# Patient Record
Sex: Female | Born: 1935 | Race: White | Hispanic: No
Health system: Southern US, Community
[De-identification: ages and names within clinical notes are randomized; demographics above are authoritative.]

## PROBLEM LIST (undated history)

## (undated) HISTORY — PX: CHOLECYSTECTOMY: SHX55

## (undated) HISTORY — PX: MINOR CARPAL TUNNEL: SHX6472

---

## 2018-10-11 ENCOUNTER — Emergency Department (HOSPITAL_COMMUNITY): Payer: Medicare Other

## 2018-10-11 ENCOUNTER — Encounter (HOSPITAL_COMMUNITY): Payer: Self-pay | Admitting: Emergency Medicine

## 2018-10-11 ENCOUNTER — Emergency Department (HOSPITAL_COMMUNITY)
Admission: EM | Admit: 2018-10-11 | Discharge: 2018-10-12 | Disposition: A | Discharge status: Home / Self Care | Payer: Medicare Other | Attending: Emergency Medicine | Admitting: Emergency Medicine

## 2018-10-11 DIAGNOSIS — F919 Conduct disorder, unspecified: Secondary | ICD-10-CM | POA: Diagnosis not present

## 2018-10-11 DIAGNOSIS — R911 Solitary pulmonary nodule: Secondary | ICD-10-CM | POA: Insufficient documentation

## 2018-10-11 DIAGNOSIS — F10221 Alcohol dependence with intoxication delirium: Secondary | ICD-10-CM | POA: Diagnosis not present

## 2018-10-11 DIAGNOSIS — F10929 Alcohol use, unspecified with intoxication, unspecified: Secondary | ICD-10-CM | POA: Diagnosis not present

## 2018-10-11 DIAGNOSIS — R4182 Altered mental status, unspecified: Secondary | ICD-10-CM | POA: Diagnosis present

## 2018-10-11 DIAGNOSIS — Z79899 Other long term (current) drug therapy: Secondary | ICD-10-CM | POA: Diagnosis not present

## 2018-10-11 LAB — CBC WITH DIFFERENTIAL/PLATELET
Abs Immature Granulocytes: 0.02 10*3/uL (ref 0.00–0.07)
Basophils Absolute: 0.1 10*3/uL (ref 0.0–0.1)
Basophils Relative: 1 %
Eosinophils Absolute: 0.1 10*3/uL (ref 0.0–0.5)
Eosinophils Relative: 1 %
HCT: 41.5 % (ref 36.0–46.0)
Hemoglobin: 13.6 g/dL (ref 12.0–15.0)
Immature Granulocytes: 0 %
Lymphocytes Relative: 38 %
Lymphs Abs: 2.7 10*3/uL (ref 0.7–4.0)
MCH: 30.6 pg (ref 26.0–34.0)
MCHC: 32.8 g/dL (ref 30.0–36.0)
MCV: 93.5 fL (ref 80.0–100.0)
Monocytes Absolute: 0.5 10*3/uL (ref 0.1–1.0)
Monocytes Relative: 7 %
Neutro Abs: 3.7 10*3/uL (ref 1.7–7.7)
Neutrophils Relative %: 53 %
Platelets: 242 10*3/uL (ref 150–400)
RBC: 4.44 MIL/uL (ref 3.87–5.11)
RDW: 12.2 % (ref 11.5–15.5)
WBC: 7 10*3/uL (ref 4.0–10.5)
nRBC: 0 % (ref 0.0–0.2)

## 2018-10-11 LAB — COMPREHENSIVE METABOLIC PANEL
ALT: 18 U/L (ref 0–44)
AST: 30 U/L (ref 15–41)
Albumin: 4.1 g/dL (ref 3.5–5.0)
Alkaline Phosphatase: 57 U/L (ref 38–126)
Anion gap: 13 (ref 5–15)
BUN: 7 mg/dL — ABNORMAL LOW (ref 8–23)
CO2: 23 mmol/L (ref 22–32)
Calcium: 9 mg/dL (ref 8.9–10.3)
Chloride: 107 mmol/L (ref 98–111)
Creatinine, Ser: 0.72 mg/dL (ref 0.44–1.00)
GFR calc Af Amer: >60 mL/min (ref >60)
GFR calc non Af Amer: >60 mL/min (ref >60)
Glucose, Bld: 83 mg/dL (ref 70–99)
Potassium: 3.6 mmol/L (ref 3.5–5.1)
Sodium: 143 mmol/L (ref 135–145)
Total Bilirubin: 0.5 mg/dL (ref 0.3–1.2)
Total Protein: 6.7 g/dL (ref 6.5–8.1)

## 2018-10-11 LAB — URINALYSIS, ROUTINE W REFLEX MICROSCOPIC
Bilirubin Urine: NEGATIVE
Glucose, UA: NEGATIVE mg/dL
Hgb urine dipstick: NEGATIVE
Ketones, ur: NEGATIVE mg/dL
Leukocytes,Ua: NEGATIVE
Nitrite: NEGATIVE
Protein, ur: NEGATIVE mg/dL
Specific Gravity, Urine: 1.004 — ABNORMAL LOW (ref 1.005–1.030)
pH: 6 (ref 5.0–8.0)

## 2018-10-11 LAB — SALICYLATE LEVEL: Salicylate Lvl: <7 mg/dL (ref 2.8–30.0)

## 2018-10-11 LAB — ACETAMINOPHEN LEVEL: Acetaminophen (Tylenol), Serum: <10 ug/mL — ABNORMAL LOW (ref 10–30)

## 2018-10-11 LAB — RAPID URINE DRUG SCREEN, HOSP PERFORMED
Amphetamines: NOT DETECTED
Barbiturates: NOT DETECTED
Benzodiazepines: NOT DETECTED
Cocaine: NOT DETECTED
Opiates: NOT DETECTED
Tetrahydrocannabinol: NOT DETECTED

## 2018-10-11 LAB — AMMONIA: Ammonia: 21 umol/L (ref 9–35)

## 2018-10-11 LAB — CBG MONITORING, ED: Glucose-Capillary: 76 mg/dL (ref 70–99)

## 2018-10-11 LAB — ETHANOL: Alcohol, Ethyl (B): 221 mg/dL — ABNORMAL HIGH (ref <10)

## 2018-10-11 MED ORDER — LORAZEPAM 2 MG/ML IJ SOLN
0.5000 mg | Freq: Once | INTRAMUSCULAR | Status: AC
  Administered 2018-10-11: 0.5 mg via INTRAVENOUS
  Filled 2018-10-11: qty 1

## 2018-10-11 NOTE — ED Notes (Signed)
Pt alert with periods of confusion. Pt denies any pain or discomfort at this time. Pt noted with a bruise to chin. Pr reports she fell, but cant remember when. Pt denies any SI, HI, and AVH at this time. Pt resting in bed at this time. Bed alarm on. Pt safety will continue to monitor.

## 2018-10-11 NOTE — BH Assessment (Addendum)
Tele Assessment Note   Patient Name: Colleen Jackson Corea MRN: [id number] Referring Physician: Dr. Kristine RoyalPeter Messick, MD [facility] of Provider: [facility]  Colleen Jackson Patteson is an 83 y.o. female [facility] due to being found in her home by her siblings in an unusual state; pt was reportedly crawling around on the floor and "squealing" and had two a large bruise on either side of her chin. Pt has no memory of what happened; when clinician inquired as to why pt was in the hospital, she initially stated, "I don't remember why I'm here." After some additional questions by clinician, pt states she remembers seeing her son around noon and driving herself home but remembers nothing until her brother came home and found her. Pt has been medically cleared.  Pt's husband passed away from prostate cancer on September 20, 2018. She shares he had been sick for a long time and that he passed away peacefully. Pt states she never left her husband and that, if she did, she ensured she was only gone for 15-20 minutes so he wouldn't fall and hurt himself. Pt shares that for months she has been getting only 2-3 hours of sleep per night, despite being prescribed Trazodone.  Pt denies SI, any prior SI, any thoughts of wanting to end her life, or any prior attempts to end her life. Pt denies HI, AVH, NSSIB, involvement in the legal system, SA, and access to weapons, including guns. Pt shares no hx of mental health or depression.  Pt was unable to provide information to contact a family member for collateral.   Pt is oriented x3; pt does not remember why she is at the hospital. Pt's remote memory is intact but her recent memory is impaired. Pt was cooperative and pleasant throughout the assessment process. Her insight, judgement, and impulse control is good at this time.  Addendum: Clinician was informed at 2349 that pt's BAL came back at 221, which may explain why pt  was on the floor and is having difficulty remembering part of her day.   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   Family History: No family history on file.  Social History:  has no history on file for tobacco, alcohol, and drug.  Additional Social History:  Alcohol / Drug Use Pain Medications: Please see MAR Prescriptions: Please see MAR Over the Counter: Please see MAR History of alcohol / drug use?: No history of alcohol / drug abuse Longest period of sobriety (when/how long): N/A  CIWA: CIWA-Ar BP: 116/65 Pulse Rate: 72 COWS:    Allergies: No Known Allergies  Home Medications: (Not in a hospital admission)   OB/GYN Status:  No LMP recorded.  General Assessment Data [facility] Assessment: In system Is this a Tele or Face-to-Face Assessment?: Tele Assessment Is this an Initial Assessment or a Re-assessment for this encounter?: Initial Assessment Patient Accompanied by:: N/A Language Other than English: No Living Arrangements: Other (Comment)(Pt lives in her own home) What gender do you identify as?: Female Marital status: Widowed [place] name: UTA Pregnancy Status: No Living Arrangements: Alone Can pt return to current living arrangement?: Yes Admission Status: Voluntary Is patient capable of signing voluntary admission?: Yes Referral Source: Self/Family/Friend Insurance type: [employer] Medicare     Crisis Care Plan  Living Arrangements: Alone Legal Guardian: (N/A) Name of Psychiatrist: None Name of Therapist: None  Education Status Is patient currently in school?: No Is the patient employed, unemployed or receiving disability?: Receiving disability income  Risk to self with the past 6 months Suicidal Ideation: No Has patient been a risk to self  within the past 6 months prior to admission? : No Suicidal Intent: No Has patient had any suicidal intent within the past 6 months prior to admission? : No Is patient at risk for suicide?: No Suicidal Plan?: No Has patient had any suicidal plan within the past 6 months prior to admission? : No Access to Means: No What has been your use of drugs/alcohol within the last 12 months?: Pt denies Previous Attempts/Gestures: No How many times?: 0 Other Self Harm Risks: Pt is a widow as of September 27, 2018 Triggers for Past Attempts: None known Intentional Self Injurious Behavior: None Family Suicide History: Unable to assess Recent stressful life event(s): Loss (Comment)(Pt's husband passed away September 27, 2018) Persecutory voices/beliefs?: No Depression: No Depression Symptoms: Insomnia, Fatigue Substance abuse history and/or treatment for substance abuse?: No Suicide prevention information given to non-admitted patients: Not applicable  Risk to Others within the past 6 months Homicidal Ideation: No Does patient have any lifetime risk of violence toward others beyond the six months prior to admission? : No Thoughts of Harm to Others: No Current Homicidal Intent: No Current Homicidal Plan: No Access to Homicidal Means: No Identified Victim: None noted History of harm to others?: No Assessment of Violence: On admission Violent Behavior Description: None noted Does patient have access to weapons?: No(Pt denied access to weapons, including guns) Criminal Charges Pending?: No Does patient have a court date: No Is patient on probation?: No  Psychosis Hallucinations: None noted Delusions: None noted  Mental Status Report Appearance/Hygiene: In scrubs Eye Contact: Good Motor Activity: Freedom of movement Speech: Logical/coherent Level of Consciousness: Alert Mood: Pleasant Affect: Appropriate to circumstance, Other (Comment)(Confused) Anxiety Level: Minimal Thought Processes:  Circumstantial, Flight of Ideas Judgement: Unimpaired Orientation: Person, Place, Time Obsessive Compulsive Thoughts/Behaviors: None  Cognitive Functioning Concentration: Fair Memory: Remote Intact, Recent Impaired Is patient IDD: No Insight: Good Impulse Control: Good Appetite: Fair Have you had any weight changes? : Loss Amount of the weight change? (lbs): (Unknown, though significant that others noted) Sleep: No Change Total Hours of Sleep: 3(Pt states she can fall asleep but can't stay asleep) Vegetative Symptoms: None  ADLScreening [facility] Assessment Services) Patient's cognitive ability adequate to safely complete daily activities?: Yes Patient able to express need for assistance with ADLs?: Yes Independently performs ADLs?: Yes (appropriate for developmental age)  Prior Inpatient Therapy Prior Inpatient Therapy: No  Prior Outpatient Therapy Prior Outpatient Therapy: No Does patient have an ACCT team?: No Does patient have Intensive In-House Services?  : No Does patient have [facility] services? : No Does patient have P4CC services?: No  ADL Screening (condition at time of admission) Patient's cognitive ability adequate to safely complete daily activities?: Yes Is the patient deaf or have difficulty hearing?: Yes Does the patient have difficulty seeing, even when wearing glasses/contacts?: No Does the patient have difficulty concentrating, remembering, or making decisions?: No Patient able to express need for assistance with ADLs?: Yes Does the patient have difficulty dressing or bathing?: No Independently performs ADLs?: Yes (appropriate for developmental age) Does the patient have difficulty walking or climbing stairs?: No Weakness of Legs: None Weakness of Arms/Hands: None  Home Assistive Devices/Equipment Home Assistive Devices/Equipment: None  Therapy Consults (therapy consults require a physician order) PT Evaluation Needed: No OT Evalulation Needed: No SLP  Evaluation Needed: No Abuse/Neglect Assessment (Assessment to be complete while patient is alone) Abuse/Neglect Assessment Can Be Completed: Unable to assess, patient is non-responsive or altered mental status Values / Beliefs Cultural Requests During Hospitalization: None Spiritual Requests During Hospitalization: None Consults Spiritual Care Consult Needed: No Social Work Consult Needed: No Merchant navy officer (For Healthcare) Does Patient Have a Medical Advance Directive?: Unable to assess, patient is non-responsive or altered mental status        Disposition: Donell Sievert, PA, reviewed pt's chart and information and determined pt should be observed overnight for safety and stabilization due to her lack of memory regarding what happened earlier today. This information was provided to pt's nurse, Melina Schools, RN, at [phone].   Disposition Initial Assessment Completed for this Encounter: Yes Patient referred to: Other (Comment)(Pt is to be observed overnight for safety and stabilization)  This service was provided via telemedicine using a 2-way, interactive audio and video technology.  Names of all persons participating in this telemedicine service and their role in this encounter. Name: Aiva Wallenstein Role: Patient  Name: Duard Brady Role: Clinician    Ralph Dowdy 10/11/2018 11:02 PM

## 2018-10-11 NOTE — ED Notes (Signed)
Pt son Alicyn Suon, called for an update on pt. Pt son decided he would call back at a later time to discuss any discharge planning for pt. Pt son telephone number is [phone].

## 2018-10-11 NOTE — ED Notes (Signed)
Pt belongings placed in locker 3 in purple zone. Pt unable to sign belongings sheet at this time.

## 2018-10-11 NOTE — ED Provider Notes (Addendum)
[facility] EMERGENCY DEPARTMENT Provider Note   CSN: [id number] Arrival date & time: 10/11/18  1750    History   Chief Complaint Chief Complaint  Patient presents with   Altered Mental Status    HPI Colleen Jackson is a 83 y.o. female presenting today for bizarre behavior.  Patient's family called EMS after the noted bizarre behavior patient having outbursts walking around on the ground and not acting her normal self.  History obtained from patient's son Colleen Jackson reports that the patient visited his house last night and was acting normally the patient then left to go home later that night.  The patient's siblings then went to check on the patient around lunchtime today when they noted that she was lying on the ground acting abnormally.  She was having outbursts and throwing herself around the room.  Siblings then called Colleen Jackson to see the patient.  They noted bruising to the patient's chin and were concerned for a fall, he reports that patient denied remembering a fall and shortly after EMS was called.  No medications administered by EMS prior to arrival.  Colleen Jackson notes that the patient's husband passed away 3 weeks ago and that she has had trouble dealing with this.  Colleen Jackson denies that the patient has any history of psychiatric illness or substance abuse.  He does note that she will occasionally drink a glass of wine however has never had known alcohol abuse.  Only medication that Colleen Jackson is aware of is melatonin and trazodone for sleep. ------------------ History obtained directly from patient aligns with what her son gland is told me.  She states that she has no memory of any fall or injury she states that the first thing she remembers today is being seen by her siblings and by EMS.  She denies any and all pain and states that she is feeling perfectly fine however she is saddened by the loss of her husband and intermittently bursting to tears on my evaluation.  She is moving all 4  extremities spontaneously and rolling around on the bed.  She is fully alert and oriented and answers all questions appropriately no noted slurred speech or neurologic deficits.  She denies any drug or alcohol use.     HPI  No past medical history on file.  There are no active problems to display for this patient.    OB History   No obstetric history on file.      Home Medications    Prior to Admission medications   Medication Sig Start Date End Date Taking? Authorizing Provider  amLODipine (NORVASC) 5 MG tablet Take 5 mg by mouth daily.   Yes [provider]  losartan (COZAAR) 100 MG tablet Take 100 mg by mouth daily.   Yes [provider]  metoprolol succinate (TOPROL-XL) 50 MG 24 hr tablet Take 50 mg by mouth daily. Take with or immediately following a meal.   Yes [provider]  rosuvastatin (CRESTOR) 5 MG tablet Take 5 mg by mouth at bedtime.   Yes [provider]    Family History No family history on file.  Social History Social History   Tobacco Use   Smoking status: Not on file  Substance Use Topics   Alcohol use: Not on file   Drug use: Not on file     Allergies   Patient has no known allergies.   Review of Systems Review of Systems  Constitutional: Negative.  Negative for chills and fever.  Eyes: Negative.  Negative for visual disturbance.  Respiratory: Negative.  Negative for cough and shortness of breath.   Cardiovascular: Negative.  Negative for chest pain.  Gastrointestinal: Negative.  Negative for abdominal pain, nausea and vomiting.  Genitourinary: Negative.  Negative for dysuria and hematuria.  Musculoskeletal: Negative.  Negative for back pain and neck pain.  Neurological: Negative.  Negative for dizziness, syncope, speech difficulty, weakness, numbness and headaches.  Psychiatric/Behavioral: Positive for behavioral problems. Negative for hallucinations, self-injury and suicidal ideas.  All other  systems reviewed and are negative.  Physical Exam Updated Vital Signs BP (!) 108/52    Pulse 73    Temp 98.1 F (36.7 C) (Oral)    Resp 19    SpO2 95%   Physical Exam Constitutional:      General: She is not in acute distress.    Appearance: Normal appearance. She is well-developed. She is not ill-appearing or diaphoretic.  HENT:     Head: Normocephalic. Contusion present. No raccoon eyes, Battle's sign or abrasion.     Jaw: There is normal jaw occlusion. No trismus.      Comments: Patient with small bruises to her chin, no tenderness or step-off of this area.  No pain or trismus on examination.    Right Ear: Tympanic membrane, ear canal and external ear normal. No hemotympanum.     Left Ear: Tympanic membrane, ear canal and external ear normal. No hemotympanum.     Nose: Nose normal. No rhinorrhea.     Mouth/Throat:     Mouth: Mucous membranes are moist.     Pharynx: Oropharynx is clear. Uvula midline.     Comments: No sign of intraoral injury. Eyes:     General: Vision grossly intact. Gaze aligned appropriately.     Extraocular Movements: Extraocular movements intact.     Conjunctiva/sclera: Conjunctivae normal.     Pupils: Pupils are equal, round, and reactive to light.     Comments: Visual fields grossly intact bilaterally  Neck:     Musculoskeletal: Full passive range of motion without pain, normal range of motion and neck supple. No spinous process tenderness or muscular tenderness.     Trachea: Trachea and phonation normal. No tracheal tenderness or tracheal deviation.  Cardiovascular:     Rate and Rhythm: Normal rate.     Pulses:          Dorsalis pedis pulses are 2+ on the right side and 2+ on the left side.       Posterior tibial pulses are 2+ on the right side and 2+ on the left side.     Heart sounds: Normal heart sounds.  Pulmonary:     Effort: Pulmonary effort is normal. No accessory muscle usage or respiratory distress.     Breath sounds: Normal breath sounds  and air entry.  Chest:     Chest wall: No deformity, tenderness or crepitus.     Comments: No sign of injury to the chest Abdominal:     General: There is no distension.     Palpations: Abdomen is soft.     Tenderness: There is no abdominal tenderness. There is no guarding or rebound.     Comments: No sign of injury to the abdomen  Musculoskeletal: Normal range of motion.     Comments: No midline C/T/L spinal tenderness to palpation, no paraspinal muscle tenderness, no deformity, crepitus, or step-off noted. No sign of injury to the neck or back.  Hips stable to compression bilaterally without pain.  Patient able to bring bilateral knees to chest without pain or difficulty.  Moving all 4 extremities spontaneously.  Is rolling around on the bed without pain or difficulty.  Feet:     Right foot:     Protective Sensation: 5 sites tested. 5 sites sensed.     Left foot:     Protective Sensation: 5 sites tested. 5 sites sensed.  Skin:    General: Skin is warm and dry.  Neurological:     Mental Status: She is alert.     GCS: GCS eye subscore is 4. GCS verbal subscore is 5. GCS motor subscore is 6.     Comments: Mental Status: Alert, oriented, thought content appropriate, able to give a coherent history. Speech fluent without evidence of aphasia. Able to follow 2 step commands without difficulty. Cranial Nerves: II: Peripheral visual fields grossly normal, pupils equal, round, reactive to light III,IV, VI: ptosis not present, extra-ocular motions intact bilaterally V,VII: smile symmetric, eyebrows raise symmetric, facial light touch sensation equal VIII: hearing grossly normal to voice X: uvula elevates symmetrically XI: bilateral shoulder shrug symmetric and strong XII: midline tongue extension without fassiculations Motor: Normal tone. 5/5 strength in upper and lower extremities bilaterally including strong and equal grip strength and dorsiflexion/plantar flexion Sensory:  Sensation intact to light touch in all extremities.  Deep Tendon Reflexes: 2+ and symmetric in patella Cerebellar: normal finger-to-nose with bilateral upper extremities. Normal heel-to -shin balance bilaterally of the lower extremity. No pronator drift.  CV: distal pulses palpable throughout  Psychiatric:        Behavior: Behavior normal.    ED Treatments / Results  Labs (all labs ordered are listed, but only abnormal results are displayed) Labs Reviewed  COMPREHENSIVE METABOLIC PANEL - Abnormal; Notable for the following components:      Result Value   BUN 7 (*)    All other components within normal limits  URINALYSIS, ROUTINE W REFLEX MICROSCOPIC - Abnormal; Notable for the following components:   Color, Urine COLORLESS (*)    Specific Gravity, Urine 1.004 (*)    All other components within normal limits  ETHANOL - Abnormal; Notable for the following components:   Alcohol, Ethyl (B) 221 (*)    All other components within normal limits  ACETAMINOPHEN LEVEL - Abnormal; Notable for the following components:   Acetaminophen (Tylenol), Serum <10 (*)    All other components within normal limits  CBC WITH DIFFERENTIAL/PLATELET  AMMONIA  RAPID URINE DRUG SCREEN, HOSP PERFORMED  SALICYLATE LEVEL  CBG MONITORING, ED    EKG EKG Interpretation  Date/Time:  Tuesday October 11 2018 18:04:36 EDT Ventricular Rate:  85 PR Interval:    QRS Duration: 140 QT Interval:  422 QTC Calculation: 502 R Axis:   -37 Text Interpretation:  Sinus rhythm Left bundle branch block Confirmed by Kristine Royal [phone]) on 10/11/2018 6:11:19 PM   Radiology Ct Head Wo Contrast  Result Date: 10/11/2018 CLINICAL DATA:  Head trauma. EXAM: CT HEAD WITHOUT CONTRAST CT CERVICAL SPINE WITHOUT CONTRAST TECHNIQUE: Multidetector CT imaging of the head and cervical spine was performed following the standard protocol without intravenous contrast. Multiplanar CT image reconstructions of the cervical spine were also  generated. COMPARISON:  None. FINDINGS: CT HEAD FINDINGS Brain: No acute cortically based infarct, intracranial hemorrhage, mass, or midline shift is identified. Mildly prominent extra-axial CSF spaces over the frontal convexities, right slightly greater than left, are favored to be secondary to atrophy although small hygromas are not excluded. Patchy hypodensities in  the subcortical and deep cerebral white matter bilaterally are nonspecific but compatible with moderate chronic small vessel ischemic disease. Vascular: Calcified atherosclerosis at the skull base. No hyperdense vessel. Skull: No fracture or suspicious osseous lesion. Sinuses/Orbits: Mild-to-moderate mucosal thickening in the maxillary sinuses. Minimal mucosal thickening in the ethmoid sinuses. Clear mastoid air cells. Bilateral cataract extraction. Other: None. CT CERVICAL SPINE FINDINGS Alignment: Trace retrolisthesis of C3 on C4 and trace anterolisthesis of C4 on C5, likely degenerative. Skull base and vertebrae: No acute fracture or suspicious osseous lesion. Moderate median C1-2 arthropathy. Soft tissues and spinal canal: No prevertebral fluid or swelling. No visible canal hematoma. Disc levels: Moderate disc space narrowing and spurring at C3-4, C5-6, and C6-7. Multilevel facet arthrosis, severe on the right at C4-5. Moderate right-sided neural foraminal stenosis at C3-4, C4-5, and C5-6. Upper chest: 10 mm peripheral rounded ground-glass opacity in the apically right upper lobe (series 9, image 81). 2 additional 3 mm ground-glass nodules in the right upper lobe (series 9, images 79 and 87), 2 mm solid nodule in the right upper lobe (series 9, image 84), and 3 mm solid nodule in the right upper lobe (series 9, image 89). Other: 12 mm low-density nodule in the right thyroid lobe. IMPRESSION: 1. No evidence of acute intracranial abnormality. 2. Moderate chronic small vessel ischemic disease. 3. No evidence of acute cervical spine fracture. 4.  Multiple right upper lobe pulmonary nodules including a 10 mm ground-glass nodule. Non-contrast chest CT at 3-6 months is recommended. If nodules persist, subsequent management will be based upon the most suspicious nodule(s). This recommendation follows the consensus statement: Guidelines for Management of Incidental Pulmonary Nodules Detected on CT Images: From the Fleischner Society 2017; Radiology 2017; 284:228-243. Electronically Signed   By: Sebastian Ache M.D.   On: 10/11/2018 19:17   Ct Cervical Spine Wo Contrast  Result Date: 10/11/2018 CLINICAL DATA:  Head trauma. EXAM: CT HEAD WITHOUT CONTRAST CT CERVICAL SPINE WITHOUT CONTRAST TECHNIQUE: Multidetector CT imaging of the head and cervical spine was performed following the standard protocol without intravenous contrast. Multiplanar CT image reconstructions of the cervical spine were also generated. COMPARISON:  None. FINDINGS: CT HEAD FINDINGS Brain: No acute cortically based infarct, intracranial hemorrhage, mass, or midline shift is identified. Mildly prominent extra-axial CSF spaces over the frontal convexities, right slightly greater than left, are favored to be secondary to atrophy although small hygromas are not excluded. Patchy hypodensities in the subcortical and deep cerebral white matter bilaterally are nonspecific but compatible with moderate chronic small vessel ischemic disease. Vascular: Calcified atherosclerosis at the skull base. No hyperdense vessel. Skull: No fracture or suspicious osseous lesion. Sinuses/Orbits: Mild-to-moderate mucosal thickening in the maxillary sinuses. Minimal mucosal thickening in the ethmoid sinuses. Clear mastoid air cells. Bilateral cataract extraction. Other: None. CT CERVICAL SPINE FINDINGS Alignment: Trace retrolisthesis of C3 on C4 and trace anterolisthesis of C4 on C5, likely degenerative. Skull base and vertebrae: No acute fracture or suspicious osseous lesion. Moderate median C1-2 arthropathy. Soft  tissues and spinal canal: No prevertebral fluid or swelling. No visible canal hematoma. Disc levels: Moderate disc space narrowing and spurring at C3-4, C5-6, and C6-7. Multilevel facet arthrosis, severe on the right at C4-5. Moderate right-sided neural foraminal stenosis at C3-4, C4-5, and C5-6. Upper chest: 10 mm peripheral rounded ground-glass opacity in the apically right upper lobe (series 9, image 81). 2 additional 3 mm ground-glass nodules in the right upper lobe (series 9, images 79 and 87), 2 mm solid nodule in the  right upper lobe (series 9, image 84), and 3 mm solid nodule in the right upper lobe (series 9, image 89). Other: 12 mm low-density nodule in the right thyroid lobe. IMPRESSION: 1. No evidence of acute intracranial abnormality. 2. Moderate chronic small vessel ischemic disease. 3. No evidence of acute cervical spine fracture. 4. Multiple right upper lobe pulmonary nodules including a 10 mm ground-glass nodule. Non-contrast chest CT at 3-6 months is recommended. If nodules persist, subsequent management will be based upon the most suspicious nodule(s). This recommendation follows the consensus statement: Guidelines for Management of Incidental Pulmonary Nodules Detected on CT Images: From the Fleischner Society 2017; Radiology 2017; 284:228-243. Electronically Signed   By: Sebastian AcheAllen  Grady M.D.   On: 10/11/2018 19:17    Procedures Procedures (including critical care time)  Medications Ordered in ED Medications  LORazepam (ATIVAN) injection 0.5 mg (0.5 mg Intravenous Given 10/11/18 1916)     Initial Impression / Assessment and Plan / ED Course  I have reviewed the triage vital signs and the nursing notes.  Pertinent labs & imaging results that were available during my care of the patient were reviewed by me and considered in my medical decision making (see chart for details).  Clinical Course as of Oct 11 2134  Tue Oct 11, 2018  1758 [phone] Wandra FeinsteinGlenn Crear [facility])   [BM]      Clinical Course User Index [BM] Bill SalinasMorelli, Myrl Lazarus A, [place]PA-C   Patient greatly improved following 0.5 mg Ativan today.  She is cooperative, calm and in no acute distress.  UDS negative Urinalysis unremarkable Tylenol level negative Salicylate level negative Ammonia level of 21 CBC within normal limits CMP unremarkable CBG 76 EKG without acute findings reviewed with Dr. Rodena MedinMessick CT head/cervical spine:  IMPRESSION:  1. No evidence of acute intracranial abnormality.  2. Moderate chronic small vessel ischemic disease.  3. No evidence of acute cervical spine fracture.  4. Multiple right upper lobe pulmonary nodules including a 10 mm  ground-glass nodule. Non-contrast chest CT at 3-6 months is  recommended. If nodules persist, subsequent management will be based  upon the most suspicious nodule(s). This recommendation follows the  consensus statement: Guidelines for Management of Incidental  Pulmonary Nodules Detected on CT Images: From the Fleischner Society  2017; Radiology 2017; 284:228-243.   Ethanol level 221 ----------------- Resting comfortably and in no acute distress.  No sign of significant head or neck injury patient does have some bruising on the chin however there is no pain, step-off or sign of intraoral injury.  Doubt facial fracture at this time as patient is without pain.  No sign of injury to the neck back chest abdomen or hips.  She moves all 4 extremities without difficulty.  Ethanol level suggest that patient was not truthful about alcohol intake today.  She denies SI/HI or hallucinations.  She does not appear to be in alcohol withdrawal at this time.  Remainder of lab work is reassuring.  Will consult TTS for psychiatric evaluation.  Patient reassessed, sleeping comfortably easily arousable to voice no acute distress, denies any and all pain, she is calm and cooperative.  At this time patient appears medically stable for psychiatric evaluation.  Patient seen and evaluated  with Dr. Rodena MedinMessick during this visit agrees with plan of care, medically clear and placed in psychiatric hold for evaluation at this time.   Note: Portions of this report may have been transcribed using voice recognition software. Every effort was made to ensure accuracy; however, inadvertent computerized transcription  errors may still be present. Final Clinical Impressions(s) / ED Diagnoses   Final diagnoses:  Alcoholic intoxication with complication [facility])  Pulmonary nodule, right    ED Discharge Orders    None       Elizabeth Palau 10/11/18 2136    Elizabeth Palau 10/11/18 2138    Wynetta Fines, MD 10/11/18 [phone]

## 2018-10-11 NOTE — ED Notes (Signed)
Pt gave permission to give son information. [facility] called and told his the patient would be held overnight.

## 2018-10-11 NOTE — ED Triage Notes (Signed)
Pt arrived with AMS. Pt family reports she was last seen normal yesterday at 12pm. Pt has bruise on her chin. Pt has hx of LBBB.

## 2018-10-11 NOTE — Discharge Instructions (Addendum)
Multiple right upper lobe pulmonary nodules were noted on chest CT.  Recommendation is that a repeat chest CT is performed in 3 months to reevaluate this area.

## 2018-10-12 ENCOUNTER — Encounter (HOSPITAL_COMMUNITY): Payer: Self-pay | Admitting: Registered Nurse

## 2018-10-12 DIAGNOSIS — F10929 Alcohol use, unspecified with intoxication, unspecified: Secondary | ICD-10-CM

## 2018-10-12 MED ORDER — AMLODIPINE BESYLATE 5 MG PO TABS
5.0000 mg | ORAL_TABLET | Freq: Every day | ORAL | Status: DC
  Administered 2018-10-12: 5 mg via ORAL
  Filled 2018-10-12: qty 1

## 2018-10-12 MED ORDER — LOSARTAN POTASSIUM 50 MG PO TABS
100.0000 mg | ORAL_TABLET | Freq: Every day | ORAL | Status: DC
  Administered 2018-10-12: 100 mg via ORAL
  Filled 2018-10-12: qty 2

## 2018-10-12 MED ORDER — ROSUVASTATIN CALCIUM 5 MG PO TABS: 5.0000 mg | ORAL_TABLET | Freq: Every day | ORAL | Status: DC

## 2018-10-12 MED ORDER — METOPROLOL SUCCINATE ER 25 MG PO TB24
50.0000 mg | ORAL_TABLET | Freq: Every day | ORAL | Status: DC
  Administered 2018-10-12: 11:00:00 50 mg via ORAL
  Filled 2018-10-12: qty 2

## 2018-10-12 NOTE — ED Notes (Signed)
Pt is calm and cooperative this morning   She states she has not been able to sleep since her husbands death.  She is not showing signs of withdrawal.  She denies S/I, H/I, and AVH.

## 2018-10-12 NOTE — ED Provider Notes (Signed)
83 year old female here with what appeared to be alcohol intoxication.  She has no psychiatric history.  She had ethanol in her blood.  Behavioral health recommended observation.  I anticipate that she will be discharged today.  No changes overnight.  Vitals:   10/11/18 2130 10/12/18 0546  BP: 116/65 (!) 168/91  Pulse: 72 (!) 104  Resp: 17 16  Temp:  98.3 F (36.8 C)  SpO2: 96% 94%     Eyvonne Mechanic, PA-C 10/12/18 0802    Sabas Sous, MD 10/12/18 [phone]

## 2018-10-12 NOTE — Consult Note (Signed)
Telepsych Consultation   Reason for Consult:  Odd behavior Referring Physician:  Elizabeth Palau Location of Patient: [facility] Location of Provider: [facility]  Patient Identification: Colleen Jackson MRN:  [id number] Principal Diagnosis: Alcohol intoxication (HCC) Diagnosis:  Principal Problem:   Alcohol intoxication (HCC)   Total Time spent with patient: 30 minutes  Subjective:   Colleen Jackson is a 83 y.o. female patient presented [facility] via EMS after she was found crawling around on floor yelling and 2 large bruise on either side of chin.  Patient was unable to tell what happened to her; she was found by a family member.  HPI:  Patient seen via tele psych by this provider; chart reviewed and consulted with Dr. Lucianne Muss on 10/12/18.  On evaluation Colleen Jackson reports she doesn't really remember what happened yesterday "all I know is I fell; but I don't know what lead up to the fall."  Patient states that her husband passed 2 weeks ago and she has not been able to sleep through the night "I'm taking Trazodone the lowest dose but it's not working.  I get about 2 hours of sleep and I'm up."  Patient states that she is sleeping some during the day but not much.  Patient denies suicidal/self-harm/homicidal ideation, psychosis, and paranoia.  Patient denies prior suicide attempt and psychiatric hospitalization.  Patient states she has had outpatient psychiatric services "It was a long time ago for emotional problems."  Patient states that she lives alone and that she is able to perform her ADL independently.  States that she still drives but only locally.  Reports that her son or brother checks on her daily.  Patient states that after the death of her husband she did not do any therapy through hospice but feels she may be ready for it now and has someone to contact to get it set up.  Informed patient I would send her more contact information just in case."  Patient was able to tell her location,  date, year, president, and date of birth.  States that she doesn't normally have problems with her memory and doesn't understand why she doesn't remember what happened yesterday.  Patient states she has a telepsych visit with her primary physician today at 2 pm.  Patient instructed to talk to her doctor about her sleeping problems and he may want to change or increase her Trazodone; Understanding voiced   During evaluation Colleen Jackson is sitting on side of bed; she is alert/oriented x 4; calm/cooperative; and mood congruent with affect.  Patient is speaking in a clear tone at moderate volume, and normal pace; with good eye contact.  Her thought process is coherent and relevant; There is no indication that she is currently responding to internal/external stimuli or experiencing delusional thought content.  Patient denies suicidal/self-harm/homicidal ideation, psychosis, and paranoia.  Patient has remained calm throughout assessment and has answered questions appropriately.  ETOH 221 possibly related to patients fall and behavior yesterday prior to ED admission   Past Psychiatric History: No prior psychiatric history  Risk to Self: Suicidal Ideation: No Suicidal Intent: No Is patient at risk for suicide?: No Suicidal Plan?: No Access to Means: No What has been your use of drugs/alcohol within the last 12 months?: Pt denies How many times?: 0 Other Self Harm Risks: Pt is a widow as of September 20, 2018 Triggers for Past Attempts: None known Intentional Self Injurious Behavior: None Risk to Others: Homicidal Ideation: No Thoughts of Harm to Others:  No Current Homicidal Intent: No Current Homicidal Plan: No Access to Homicidal Means: No Identified Victim: None noted History of harm to others?: No Assessment of Violence: On admission Violent Behavior Description: None noted Does patient have access to weapons?: No(Pt denied access to weapons, including guns) Criminal Charges Pending?: No Does  patient have a court date: No Prior Inpatient Therapy: Prior Inpatient Therapy: No Prior Outpatient Therapy: Prior Outpatient Therapy: No Does patient have an ACCT team?: No Does patient have Intensive In-House Services?  : No Does patient have [facility] services? : No Does patient have P4CC services?: No  Past Medical History: History reviewed. No pertinent past medical history.  Past Surgical History:  Procedure Laterality Date  . CHOLECYSTECTOMY    . MINOR CARPAL TUNNEL     Family History: History reviewed. No pertinent family history. Family Psychiatric  History: Unaware Social History:  Social History   Substance and Sexual Activity  Alcohol Use Not on file     Social History   Substance and Sexual Activity  Drug Use Not on file    Social History   Socioeconomic History  . Marital status: Widowed    Spouse name: Not on file  . Number of children: Not on file  . Years of education: Not on file  . Highest education level: Not on file  Occupational History  . Not on file  Social Needs  . Financial resource strain: Not on file  . Food insecurity:    Worry: Not on file    Inability: Not on file  . Transportation needs:    Medical: Not on file    Non-medical: Not on file  Tobacco Use  . Smoking status: Not on file  Substance and Sexual Activity  . Alcohol use: Not on file  . Drug use: Not on file  . Sexual activity: Not on file  Lifestyle  . Physical activity:    Days per week: Not on file    Minutes per session: Not on file  . Stress: Not on file  Relationships  . Social connections:    Talks on phone: Not on file    Gets together: Not on file    Attends religious service: Not on file    Active member of club or organization: Not on file    Attends meetings of clubs or organizations: Not on file    Relationship status: Not on file  Other Topics Concern  . Not on file  Social History Narrative  . Not on file   Additional Social History:     Allergies:  No Known Allergies  Labs:  Results for orders placed or performed during the hospital encounter of 10/11/18 (from the past 48 hour(s))  CBG monitoring, ED     Status: None   Collection Time: 10/11/18  6:56 PM  Result Value Ref Range   Glucose-Capillary 76 70 - 99 mg/dL  CBC with Differential     Status: None   Collection Time: 10/11/18  6:57 PM  Result Value Ref Range   WBC 7.0 4.0 - 10.5 K/uL   RBC 4.44 3.87 - 5.11 MIL/uL   Hemoglobin 13.6 12.0 - 15.0 g/dL   HCT 16.141.5 09.636.0 - 04.546.0 %   MCV 93.5 80.0 - 100.0 fL   MCH 30.6 26.0 - 34.0 pg   MCHC 32.8 30.0 - 36.0 g/dL   RDW 40.912.2 81.111.5 - 91.415.5 %   Platelets 242 150 - 400 K/uL   nRBC 0.0 0.0 - 0.2 %  Neutrophils Relative % 53 %   Neutro Abs 3.7 1.7 - 7.7 K/uL   Lymphocytes Relative 38 %   Lymphs Abs 2.7 0.7 - 4.0 K/uL   Monocytes Relative 7 %   Monocytes Absolute 0.5 0.1 - 1.0 K/uL   Eosinophils Relative 1 %   Eosinophils Absolute 0.1 0.0 - 0.5 K/uL   Basophils Relative 1 %   Basophils Absolute 0.1 0.0 - 0.1 K/uL   Immature Granulocytes 0 %   Abs Immature Granulocytes 0.02 0.00 - 0.07 K/uL    Comment: Performed at [facility], [address], [place], [place] [address]  Comprehensive metabolic panel     Status: Abnormal   Collection Time: 10/11/18  6:57 PM  Result Value Ref Range   Sodium 143 135 - 145 mmol/L   Potassium 3.6 3.5 - 5.1 mmol/L   Chloride 107 98 - 111 mmol/L   CO2 23 22 - 32 mmol/L   Glucose, Bld 83 70 - 99 mg/dL   BUN 7 (L) 8 - 23 mg/dL   Creatinine, Ser 2.95 0.44 - 1.00 mg/dL   Calcium 9.0 8.9 - 62.1 mg/dL   Total Protein 6.7 6.5 - 8.1 g/dL   Albumin 4.1 3.5 - 5.0 g/dL   AST 30 15 - 41 U/L   ALT 18 0 - 44 U/L   Alkaline Phosphatase 57 38 - 126 U/L   Total Bilirubin 0.5 0.3 - 1.2 mg/dL   GFR calc non Af Amer >60 >60 mL/min   GFR calc Af Amer >60 >60 mL/min   Anion gap 13 5 - 15    Comment: Performed at [facility], [address] [place], [place] [address]  Ammonia      Status: None   Collection Time: 10/11/18  6:57 PM  Result Value Ref Range   Ammonia 21 9 - 35 umol/L    Comment: Performed at [facility], [address] [place], [place] [address]  Ethanol     Status: Abnormal   Collection Time: 10/11/18  6:57 PM  Result Value Ref Range   Alcohol, Ethyl (B) 221 (H) <10 mg/dL    Comment: (NOTE) Lowest detectable limit for serum alcohol is 10 mg/dL. For medical purposes only. Performed at [facility], [address], [place], [place] [address]   Salicylate level     Status: None   Collection Time: 10/11/18  6:57 PM  Result Value Ref Range   Salicylate Lvl <7.0 2.8 - 30.0 mg/dL    Comment: Performed at [facility], [address], [place], [place] [address]  Acetaminophen level     Status: Abnormal   Collection Time: 10/11/18  6:57 PM  Result Value Ref Range   Acetaminophen (Tylenol), Serum <10 (L) 10 - 30 ug/mL    Comment: (NOTE) Therapeutic concentrations vary significantly. A range of 10-30 ug/mL  may be an effective concentration for many patients. However, some  are best treated at concentrations outside of this range. Acetaminophen concentrations >150 ug/mL at 4 hours after ingestion  and >50 ug/mL at 12 hours after ingestion are often associated with  toxic reactions. Performed at [facility], [address], [place], [place] [address]   Urinalysis, Routine w reflex microscopic     Status: Abnormal   Collection Time: 10/11/18  7:05 PM  Result Value Ref Range   Color, Urine COLORLESS (A) YELLOW   APPearance CLEAR CLEAR   Specific Gravity, Urine 1.004 (L)  1.005 - 1.030   pH 6.0 5.0 - 8.0   Glucose, UA NEGATIVE NEGATIVE mg/dL   Hgb urine dipstick NEGATIVE NEGATIVE   Bilirubin Urine NEGATIVE NEGATIVE   Ketones, ur NEGATIVE NEGATIVE mg/dL   Protein, ur NEGATIVE NEGATIVE mg/dL   Nitrite NEGATIVE NEGATIVE   Leukocytes,Ua NEGATIVE NEGATIVE    Comment: Performed at [facility], [address]  [place], [place] [address]  Rapid urine drug screen (hospital performed)     Status: None   Collection Time: 10/11/18  7:05 PM  Result Value Ref Range   Opiates NONE DETECTED NONE DETECTED   Cocaine NONE DETECTED NONE DETECTED   Benzodiazepines NONE DETECTED NONE DETECTED   Amphetamines NONE DETECTED NONE DETECTED   Tetrahydrocannabinol NONE DETECTED NONE DETECTED   Barbiturates NONE DETECTED NONE DETECTED    Comment: (NOTE) DRUG SCREEN FOR MEDICAL PURPOSES ONLY.  IF CONFIRMATION IS NEEDED FOR ANY PURPOSE, NOTIFY LAB WITHIN 5 DAYS. LOWEST DETECTABLE LIMITS FOR URINE DRUG SCREEN Drug Class                     Cutoff (ng/mL) Amphetamine and metabolites    1000 Barbiturate and metabolites    200 Benzodiazepine                 200 Tricyclics and metabolites     300 Opiates and metabolites        300 Cocaine and metabolites        300 THC                            50 Performed at [facility], [address], [place], [place] [address]     Medications:  Current Facility-Administered Medications  Medication Dose Route Frequency Provider Last Rate Last Dose  . amLODipine (NORVASC) tablet 5 mg  5 mg Oral Daily Mesner, Barbara Cower, MD      . losartan (COZAAR) tablet 100 mg  100 mg Oral Daily Mesner, Jason, MD      . metoprolol succinate (TOPROL-XL) 24 hr tablet 50 mg  50 mg Oral Daily Mesner, Jason, MD      . rosuvastatin (CRESTOR) tablet 5 mg  5 mg Oral QHS Mesner, Barbara Cower, MD       Current Outpatient Medications  Medication Sig Dispense Refill  . amLODipine (NORVASC) 5 MG tablet Take 5 mg by mouth daily.    Marland Kitchen losartan (COZAAR) 100 MG tablet Take 100 mg by mouth daily.    . metoprolol succinate (TOPROL-XL) 50 MG 24 hr tablet Take 50 mg by mouth daily. Take with or immediately following a meal.    . rosuvastatin (CRESTOR) 5 MG tablet Take 5 mg by mouth at bedtime.      Musculoskeletal: Strength & Muscle Tone: within normal limits Gait & Station: normal Patient leans:  N/A  Psychiatric Specialty Exam: Physical Exam  ROS  Blood pressure (!) 168/91, pulse (!) 104, temperature 98.3 F (36.8 C), temperature source Oral, resp. rate 16, SpO2 94 %.There is no height or weight on file to calculate BMI.  General Appearance: Casual  Eye Contact:  Good  Speech:  Clear and Coherent and Normal Rate  Volume:  Normal  Mood:  Appropriate  Affect:  Appropriate and Congruent  Thought Process:  Coherent and Goal Directed  Orientation:  Full (Time, Place, and Person)  Thought Content:  WDL and Logical  Suicidal Thoughts:  No  Homicidal Thoughts:  No  Memory:  Immediate;   Good Recent;   Fair Remote;   Good  Judgement:  Intact  Insight:  Present  Psychomotor Activity:  Normal  Concentration:  Concentration: Good and Attention Span: Good  Recall:  Good except for anything prior to or after her fall  Fund of Knowledge:  Good  Language:  Good  Akathisia:  No  Handed:  Right  AIMS (if indicated):     Assets:  Communication Skills Desire for Improvement Housing Leisure Time Physical Health Social Support Transportation  ADL's:  Intact  Cognition:  WNL  Sleep:        Treatment Plan Summary: Plan Follow up with primary physician.  Give resources for Hospice Therapy and outpatient psychiatric services  Disposition: No evidence of imminent risk to self or others at present.   Patient does not meet criteria for psychiatric inpatient admission. Supportive therapy provided about ongoing stressors. Discussed crisis plan, support from social network, calling 911, coming to the Emergency Department, and calling Suicide Hotline.  This service was provided via telemedicine using a 2-way, interactive audio and video technology.  Names of all persons participating in this telemedicine service and their role in this encounter. Name: Assunta Found, NP Role: Tele psych assessment  Name: Dr. Lucianne Muss Role: Psychiatrist  Name: Colleen Jackson Role: Patient  Name: Burna Forts,  PA  Role: Informed of above recommendation and disposition     , NP 10/12/2018 10:10 AM

## 2018-10-12 NOTE — ED Notes (Signed)
Dr Rodena Medin, made aware that patient home meds has not been ordered.

## 2018-10-12 NOTE — ED Notes (Signed)
Pt discharged safely with son.  All discharge instructions and resources were reviewed with son and with patient.

## 2018-10-12 NOTE — ED Notes (Signed)
Dr. Donnald Garre made aware that patient home meds had not been ordered.

## 2018-10-12 NOTE — ED Notes (Signed)
telepsych machine in room

## 2020-03-18 DIAGNOSIS — F329 Major depressive disorder, single episode, unspecified: Secondary | ICD-10-CM | POA: Diagnosis not present

## 2020-03-18 DIAGNOSIS — Z23 Encounter for immunization: Secondary | ICD-10-CM | POA: Diagnosis not present

## 2020-03-18 DIAGNOSIS — I10 Essential (primary) hypertension: Secondary | ICD-10-CM | POA: Diagnosis not present

## 2020-03-18 DIAGNOSIS — J302 Other seasonal allergic rhinitis: Secondary | ICD-10-CM | POA: Diagnosis not present

## 2020-03-18 DIAGNOSIS — G4700 Insomnia, unspecified: Secondary | ICD-10-CM | POA: Diagnosis not present

## 2020-09-16 DIAGNOSIS — I10 Essential (primary) hypertension: Secondary | ICD-10-CM | POA: Diagnosis not present

## 2020-10-23 DIAGNOSIS — G4700 Insomnia, unspecified: Secondary | ICD-10-CM | POA: Diagnosis not present

## 2020-10-23 DIAGNOSIS — R82998 Other abnormal findings in urine: Secondary | ICD-10-CM | POA: Diagnosis not present

## 2020-10-23 DIAGNOSIS — Z13828 Encounter for screening for other musculoskeletal disorder: Secondary | ICD-10-CM | POA: Diagnosis not present

## 2020-10-23 DIAGNOSIS — I10 Essential (primary) hypertension: Secondary | ICD-10-CM | POA: Diagnosis not present

## 2020-10-23 DIAGNOSIS — Z1331 Encounter for screening for depression: Secondary | ICD-10-CM | POA: Diagnosis not present

## 2020-10-23 DIAGNOSIS — Z0000 Encounter for general adult medical examination without abnormal findings: Secondary | ICD-10-CM | POA: Diagnosis not present

## 2020-10-23 DIAGNOSIS — Z1339 Encounter for screening examination for other mental health and behavioral disorders: Secondary | ICD-10-CM | POA: Diagnosis not present

## 2021-03-31 DIAGNOSIS — G4700 Insomnia, unspecified: Secondary | ICD-10-CM | POA: Diagnosis not present

## 2021-03-31 DIAGNOSIS — J302 Other seasonal allergic rhinitis: Secondary | ICD-10-CM | POA: Diagnosis not present

## 2021-03-31 DIAGNOSIS — I10 Essential (primary) hypertension: Secondary | ICD-10-CM | POA: Diagnosis not present

## 2021-03-31 DIAGNOSIS — Z23 Encounter for immunization: Secondary | ICD-10-CM | POA: Diagnosis not present

## 2021-03-31 DIAGNOSIS — R2689 Other abnormalities of gait and mobility: Secondary | ICD-10-CM | POA: Diagnosis not present

## 2021-04-23 DIAGNOSIS — R278 Other lack of coordination: Secondary | ICD-10-CM | POA: Diagnosis not present

## 2021-04-23 DIAGNOSIS — R4189 Other symptoms and signs involving cognitive functions and awareness: Secondary | ICD-10-CM | POA: Diagnosis not present

## 2021-10-20 DIAGNOSIS — I10 Essential (primary) hypertension: Secondary | ICD-10-CM | POA: Diagnosis not present

## 2021-10-27 DIAGNOSIS — Z1339 Encounter for screening examination for other mental health and behavioral disorders: Secondary | ICD-10-CM | POA: Diagnosis not present

## 2021-10-27 DIAGNOSIS — R2689 Other abnormalities of gait and mobility: Secondary | ICD-10-CM | POA: Diagnosis not present

## 2021-10-27 DIAGNOSIS — G4700 Insomnia, unspecified: Secondary | ICD-10-CM | POA: Diagnosis not present

## 2021-10-27 DIAGNOSIS — Z0000 Encounter for general adult medical examination without abnormal findings: Secondary | ICD-10-CM | POA: Diagnosis not present

## 2021-10-27 DIAGNOSIS — F329 Major depressive disorder, single episode, unspecified: Secondary | ICD-10-CM | POA: Diagnosis not present

## 2021-10-27 DIAGNOSIS — Z1331 Encounter for screening for depression: Secondary | ICD-10-CM | POA: Diagnosis not present

## 2021-10-27 DIAGNOSIS — R82998 Other abnormal findings in urine: Secondary | ICD-10-CM | POA: Diagnosis not present

## 2021-10-27 DIAGNOSIS — I10 Essential (primary) hypertension: Secondary | ICD-10-CM | POA: Diagnosis not present

## 2021-10-27 DIAGNOSIS — J302 Other seasonal allergic rhinitis: Secondary | ICD-10-CM | POA: Diagnosis not present

## 2021-10-27 DIAGNOSIS — Z1212 Encounter for screening for malignant neoplasm of rectum: Secondary | ICD-10-CM | POA: Diagnosis not present

## 2021-11-26 DIAGNOSIS — R410 Disorientation, unspecified: Secondary | ICD-10-CM | POA: Diagnosis not present

## 2021-11-26 DIAGNOSIS — R531 Weakness: Secondary | ICD-10-CM | POA: Diagnosis not present

## 2021-11-27 ENCOUNTER — Ambulatory Visit
Admission: RE | Admit: 2021-11-27 | Discharge: 2021-11-27 | Disposition: A | Discharge status: Home / Self Care | Payer: Medicare PPO | Source: Ambulatory Visit | Attending: Internal Medicine | Admitting: Internal Medicine

## 2021-11-27 ENCOUNTER — Other Ambulatory Visit: Payer: Self-pay | Admitting: Internal Medicine

## 2021-11-27 DIAGNOSIS — R410 Disorientation, unspecified: Secondary | ICD-10-CM

## 2021-11-27 DIAGNOSIS — R413 Other amnesia: Secondary | ICD-10-CM | POA: Diagnosis not present

## 2022-01-20 ENCOUNTER — Emergency Department: Payer: Medicare PPO

## 2022-01-20 ENCOUNTER — Other Ambulatory Visit: Payer: Self-pay

## 2022-01-20 DIAGNOSIS — R410 Disorientation, unspecified: Secondary | ICD-10-CM | POA: Diagnosis not present

## 2022-01-20 DIAGNOSIS — Z043 Encounter for examination and observation following other accident: Secondary | ICD-10-CM | POA: Diagnosis not present

## 2022-01-20 DIAGNOSIS — S0990XA Unspecified injury of head, initial encounter: Secondary | ICD-10-CM | POA: Diagnosis not present

## 2022-01-20 DIAGNOSIS — R8289 Other abnormal findings on cytological and histological examination of urine: Secondary | ICD-10-CM | POA: Insufficient documentation

## 2022-01-20 DIAGNOSIS — R000 Tachycardia, unspecified: Secondary | ICD-10-CM | POA: Diagnosis not present

## 2022-01-20 DIAGNOSIS — F22 Delusional disorders: Secondary | ICD-10-CM | POA: Diagnosis not present

## 2022-01-20 DIAGNOSIS — F0390 Unspecified dementia without behavioral disturbance: Secondary | ICD-10-CM | POA: Diagnosis not present

## 2022-01-20 DIAGNOSIS — R9431 Abnormal electrocardiogram [ECG] [EKG]: Secondary | ICD-10-CM | POA: Diagnosis not present

## 2022-01-20 DIAGNOSIS — W19XXXA Unspecified fall, initial encounter: Secondary | ICD-10-CM | POA: Diagnosis not present

## 2022-01-20 DIAGNOSIS — I10 Essential (primary) hypertension: Secondary | ICD-10-CM | POA: Diagnosis not present

## 2022-01-20 LAB — CBC WITH DIFFERENTIAL/PLATELET
Abs Immature Granulocytes: 0.02 10*3/uL (ref 0.00–0.07)
Basophils Absolute: 0.1 10*3/uL (ref 0.0–0.1)
Basophils Relative: 1 %
Eosinophils Absolute: 0.1 10*3/uL (ref 0.0–0.5)
Eosinophils Relative: 1 %
HCT: 39.3 % (ref 36.0–46.0)
Hemoglobin: 13.3 g/dL (ref 12.0–15.0)
Immature Granulocytes: 0 %
Lymphocytes Relative: 38 %
Lymphs Abs: 2.6 10*3/uL (ref 0.7–4.0)
MCH: 31.9 pg (ref 26.0–34.0)
MCHC: 33.8 g/dL (ref 30.0–36.0)
MCV: 94.2 fL (ref 80.0–100.0)
Monocytes Absolute: 0.7 10*3/uL (ref 0.1–1.0)
Monocytes Relative: 11 %
Neutro Abs: 3.4 10*3/uL (ref 1.7–7.7)
Neutrophils Relative %: 49 %
Platelets: 271 10*3/uL (ref 150–400)
RBC: 4.17 MIL/uL (ref 3.87–5.11)
RDW: 12.5 % (ref 11.5–15.5)
WBC: 6.8 10*3/uL (ref 4.0–10.5)
nRBC: 0 % (ref 0.0–0.2)

## 2022-01-20 LAB — URINALYSIS, ROUTINE W REFLEX MICROSCOPIC
Bilirubin Urine: NEGATIVE
Glucose, UA: NEGATIVE mg/dL
Hgb urine dipstick: NEGATIVE
Ketones, ur: NEGATIVE mg/dL
Nitrite: NEGATIVE
Protein, ur: NEGATIVE mg/dL
Specific Gravity, Urine: 1.009 (ref 1.005–1.030)
pH: 8 (ref 5.0–8.0)

## 2022-01-20 LAB — CK: Total CK: 87 U/L (ref 38–234)

## 2022-01-20 LAB — COMPREHENSIVE METABOLIC PANEL
ALT: 17 U/L (ref 0–44)
AST: 25 U/L (ref 15–41)
Albumin: 4.3 g/dL (ref 3.5–5.0)
Alkaline Phosphatase: 51 U/L (ref 38–126)
Anion gap: 12 (ref 5–15)
BUN: 10 mg/dL (ref 8–23)
CO2: 21 mmol/L — ABNORMAL LOW (ref 22–32)
Calcium: 9.1 mg/dL (ref 8.9–10.3)
Chloride: 107 mmol/L (ref 98–111)
Creatinine, Ser: 0.63 mg/dL (ref 0.44–1.00)
GFR, Estimated: >60 mL/min (ref >60)
Glucose, Bld: 96 mg/dL (ref 70–99)
Potassium: 4 mmol/L (ref 3.5–5.1)
Sodium: 140 mmol/L (ref 135–145)
Total Bilirubin: 0.6 mg/dL (ref 0.3–1.2)
Total Protein: 7.2 g/dL (ref 6.5–8.1)

## 2022-01-20 LAB — TROPONIN I (HIGH SENSITIVITY)
Troponin I (High Sensitivity): 6 ng/L (ref <18)
Troponin I (High Sensitivity): 7 ng/L (ref <18)

## 2022-01-20 NOTE — ED Triage Notes (Signed)
Per [facility] pt coming home- patients son found on floor and patient was conscious but not alert. Reports hx of mild dementia but patient is more altered then normal.

## 2022-01-20 NOTE — ED Triage Notes (Signed)
Pt to triage via [facility] with son. Son reports pt had fall today, found on floor at home. Unknown mechanism of fall. Pt unable to recall events.  Pt has hx of dementia, but son reports pt is more altered, agitated, and paranoid. Pt denies pain, no use of blood thinners. No obvious injuries noted.

## 2022-01-21 ENCOUNTER — Emergency Department
Admission: EM | Admit: 2022-01-21 | Discharge: 2022-01-21 | Disposition: A | Discharge status: Home / Self Care | Payer: Medicare PPO | Attending: Emergency Medicine | Admitting: Emergency Medicine

## 2022-01-21 DIAGNOSIS — W19XXXA Unspecified fall, initial encounter: Secondary | ICD-10-CM

## 2022-01-21 MED ORDER — FOSFOMYCIN TROMETHAMINE 3 G PO PACK
3.0000 g | PACK | Freq: Once | ORAL | Status: AC
  Administered 2022-01-21: 3 g via ORAL
  Filled 2022-01-21: qty 3

## 2022-01-21 NOTE — ED Notes (Signed)
Pt brought in via ems from home.  Son found pt lying on the floor .  Pt doesn't remember what happened.  Pt has dementia.  Son reports pt was more confused after the fall.  Pt denies any pain.  No blood thinners.  Pt denies h/a.  No slurred speech . Pt alert.

## 2022-01-21 NOTE — ED Provider Notes (Signed)
[facility] Provider Note    Event Date/Time   First MD Initiated Contact with Patient 01/21/22 0148     (approximate)   History   Fall (Also AMS)   HPI  Colleen Jackson is a 86 y.o. female who presents to the ED for evaluation of Fall (Also AMS)   Presents to the ED, accompanied by her son, for elevation after being found down.  Son provides majority of history as patient is demented at baseline.  She lives at a local independent living facility.  He reports that when he went to visit her, he found her laying on the floor and seemed confused.  She was emotionally labile, paranoid and "delirious.  "This lasted a couple hours and has since resolved by the time I evaluate the patient.  She unfortunately waits about 8 hours prior to my evaluation due to prolonged wait times and bed holds in our ER.  He reports that she has been more normal for the past 5 or 6 hours and seems her typical self.  Patient reports she feels fine and has no complaints.  History limited by her disorientation, which son reports is at baseline.  Physical Exam   Triage Vital Signs: ED Triage Vitals  Enc Vitals Group     BP 01/20/22 1909 (!) 141/85     Pulse Rate 01/20/22 1909 75     Resp 01/20/22 1909 18     Temp 01/20/22 1909 98.6 F (37 C)     Temp Source 01/20/22 1911 Oral     SpO2 01/20/22 1838 97 %     Weight 01/20/22 1911 135 lb (61.2 kg)     Height 01/20/22 1911 5\' 6"  (1.676 m)     Head Circumference --      Peak Flow --      Pain Score --      Pain Loc --      Pain Edu? --      Excl. in GC? --     Most recent vital signs: Vitals:   01/21/22 0027 01/21/22 0200  BP: (!) 159/76 (!) 162/84  Pulse: 72 81  Resp: 16   Temp: 98 F (36.7 C)   SpO2: 95% 93%    General: Awake, no distress.  Pleasantly disoriented, follows commands in all 4.  Stands and dependently and ambulates with a normal gait. CV:  Good peripheral perfusion.  Resp:  Normal effort.  Abd:  No  distention.  MSK:  No deformity noted.  Palpation of all 4 extremities without evidence of tenderness or deformity Neuro:  No focal deficits appreciated. Cranial nerves II through XII intact 5/5 strength and sensation in all 4 extremities Other:     ED Results / Procedures / Treatments   Labs (all labs ordered are listed, but only abnormal results are displayed) Labs Reviewed  COMPREHENSIVE METABOLIC PANEL - Abnormal; Notable for the following components:      Result Value   CO2 21 (*)    All other components within normal limits  URINALYSIS, ROUTINE W REFLEX MICROSCOPIC - Abnormal; Notable for the following components:   Color, Urine YELLOW (*)    APPearance CLEAR (*)    Leukocytes,Ua MODERATE (*)    Bacteria, UA RARE (*)    All other components within normal limits  URINE CULTURE  CBC WITH DIFFERENTIAL/PLATELET  CK  TROPONIN I (HIGH SENSITIVITY)  TROPONIN I (HIGH SENSITIVITY)    EKG Sinus rhythm with a rate of 83 bpm.  Leftward axis and left bundle morphology without STEMI by Sgarbossa criteria  RADIOLOGY CT head interpreted by me without evidence of acute intracranial pathology  Official radiology report(s): CT Head Wo Contrast  Result Date: 01/20/2022 CLINICAL DATA:  Fall, found down, dementia EXAM: CT HEAD WITHOUT CONTRAST CT CERVICAL SPINE WITHOUT CONTRAST TECHNIQUE: Multidetector CT imaging of the head and cervical spine was performed following the standard protocol without intravenous contrast. Multiplanar CT image reconstructions of the cervical spine were also generated. RADIATION DOSE REDUCTION: This exam was performed according to the departmental dose-optimization program which includes automated exposure control, adjustment of the mA and/or kV according to patient size and/or use of iterative reconstruction technique. COMPARISON:  CT head dated 11/27/2021. CT head/cervical spine dated 10/11/2018. FINDINGS: CT HEAD FINDINGS Brain: No evidence of acute infarction,  hemorrhage, hydrocephalus, extra-axial collection or mass lesion/mass effect. Global cortical atrophy. Subcortical white matter and periventricular small vessel ischemic changes. Vascular: Intracranial atherosclerosis. Skull: Normal. Negative for fracture or focal lesion. Sinuses/Orbits: The visualized paranasal sinuses are essentially clear. The mastoid air cells are unopacified. Other: None. CT CERVICAL SPINE FINDINGS Alignment: Normal cervical lordosis. Skull base and vertebrae: No acute fracture. No primary bone lesion or focal pathologic process. Soft tissues and spinal canal: No prevertebral fluid or swelling. No visible canal hematoma. Disc levels: Mild degenerative changes of the mid cervical spine. Spinal canal is patent. Upper chest: Visualized lung apices are notable for a stable ground-glass nodule in the right lung apex (series 7/image 77), without solid component, unchanged since 2020 and of questionable clinical significance given the patient's age and comorbidities. No follow-up is recommended. Other: Visualized thyroid is unremarkable. IMPRESSION: No evidence of acute intracranial abnormality. Atrophy with small vessel ischemic changes. No evidence of acute traumatic injury to the cervical spine. Mild degenerative changes. Electronically Signed   By: Charline Bills M.D.   On: 01/20/2022 19:38   CT Cervical Spine Wo Contrast  Result Date: 01/20/2022 CLINICAL DATA:  Fall, found down, dementia EXAM: CT HEAD WITHOUT CONTRAST CT CERVICAL SPINE WITHOUT CONTRAST TECHNIQUE: Multidetector CT imaging of the head and cervical spine was performed following the standard protocol without intravenous contrast. Multiplanar CT image reconstructions of the cervical spine were also generated. RADIATION DOSE REDUCTION: This exam was performed according to the departmental dose-optimization program which includes automated exposure control, adjustment of the mA and/or kV according to patient size and/or use of  iterative reconstruction technique. COMPARISON:  CT head dated 11/27/2021. CT head/cervical spine dated 10/11/2018. FINDINGS: CT HEAD FINDINGS Brain: No evidence of acute infarction, hemorrhage, hydrocephalus, extra-axial collection or mass lesion/mass effect. Global cortical atrophy. Subcortical white matter and periventricular small vessel ischemic changes. Vascular: Intracranial atherosclerosis. Skull: Normal. Negative for fracture or focal lesion. Sinuses/Orbits: The visualized paranasal sinuses are essentially clear. The mastoid air cells are unopacified. Other: None. CT CERVICAL SPINE FINDINGS Alignment: Normal cervical lordosis. Skull base and vertebrae: No acute fracture. No primary bone lesion or focal pathologic process. Soft tissues and spinal canal: No prevertebral fluid or swelling. No visible canal hematoma. Disc levels: Mild degenerative changes of the mid cervical spine. Spinal canal is patent. Upper chest: Visualized lung apices are notable for a stable ground-glass nodule in the right lung apex (series 7/image 77), without solid component, unchanged since 2020 and of questionable clinical significance given the patient's age and comorbidities. No follow-up is recommended. Other: Visualized thyroid is unremarkable. IMPRESSION: No evidence of acute intracranial abnormality. Atrophy with small vessel ischemic changes. No evidence of acute traumatic injury  to the cervical spine. Mild degenerative changes. Electronically Signed   By: Charline Bills M.D.   On: 01/20/2022 19:38    PROCEDURES and INTERVENTIONS:  .1-3 Lead EKG Interpretation  Performed by: Delton Prairie, MD Authorized by: Delton Prairie, MD     Interpretation: normal     ECG rate:  78   ECG rate assessment: normal     Rhythm: sinus rhythm     Ectopy: none     Conduction: normal     Medications  fosfomycin (MONUROL) packet 3 g (has no administration in time range)     IMPRESSION / MDM / ASSESSMENT AND PLAN / ED COURSE   I reviewed the triage vital signs and the nursing notes.  Differential diagnosis includes, but is not limited to, ICH, skull fracture, stroke, seizure, cardiogenic syncope, UTI  {Patient presents with symptoms of an acute illness or injury that is potentially life-threatening   Patient presented to the ED after being found down at her SNF.  She look systemically well without signs of significant traumatic pathology.  No signs of neurologic or vascular deficits.  Blood work is reassuring with a normal CMP and CBC.  Urine with moderate leukocytes.  She denies any urinary symptoms, but uncertain due to her disorientation.  After discussing risks and benefits with the son we decided to pursue single dose of fosfomycin, urine culture and following up with PCP.  We discussed neurology referral due to patient's increasing delirium, disorientation and their questions about possible medication management.  We discussed return precautions and she is suitable for outpatient management.     FINAL CLINICAL IMPRESSION(S) / ED DIAGNOSES   Final diagnoses:  Fall, initial encounter     Rx / DC Orders   ED Discharge Orders     None        Note:  This document was prepared using Dragon voice recognition software and may include unintentional dictation errors.   Delton Prairie, MD 01/21/22 [phone]

## 2022-01-21 NOTE — ED Notes (Signed)
Patient discharged to home per MD order. Patient in stable condition, and deemed medically cleared by ED provider for discharge. Discharge instructions reviewed with patient/family using "Teach Back"; verbalized understanding of medication education and administration, and information about follow-up care. Denies further concerns. ° °

## 2022-01-22 LAB — URINE CULTURE

## 2022-01-27 DIAGNOSIS — R413 Other amnesia: Secondary | ICD-10-CM | POA: Diagnosis not present

## 2022-01-27 DIAGNOSIS — R4789 Other speech disturbances: Secondary | ICD-10-CM | POA: Diagnosis not present

## 2022-01-27 DIAGNOSIS — R55 Syncope and collapse: Secondary | ICD-10-CM | POA: Diagnosis not present

## 2022-05-11 DIAGNOSIS — Z23 Encounter for immunization: Secondary | ICD-10-CM | POA: Diagnosis not present

## 2022-05-11 DIAGNOSIS — E785 Hyperlipidemia, unspecified: Secondary | ICD-10-CM | POA: Diagnosis not present

## 2022-05-11 DIAGNOSIS — G4700 Insomnia, unspecified: Secondary | ICD-10-CM | POA: Diagnosis not present

## 2022-05-11 DIAGNOSIS — I10 Essential (primary) hypertension: Secondary | ICD-10-CM | POA: Diagnosis not present

## 2022-11-04 DIAGNOSIS — E785 Hyperlipidemia, unspecified: Secondary | ICD-10-CM | POA: Diagnosis not present

## 2022-11-04 DIAGNOSIS — I10 Essential (primary) hypertension: Secondary | ICD-10-CM | POA: Diagnosis not present

## 2022-11-04 DIAGNOSIS — R7989 Other specified abnormal findings of blood chemistry: Secondary | ICD-10-CM | POA: Diagnosis not present

## 2022-11-11 DIAGNOSIS — E785 Hyperlipidemia, unspecified: Secondary | ICD-10-CM | POA: Diagnosis not present

## 2022-11-11 DIAGNOSIS — I10 Essential (primary) hypertension: Secondary | ICD-10-CM | POA: Diagnosis not present

## 2022-11-11 DIAGNOSIS — Z23 Encounter for immunization: Secondary | ICD-10-CM | POA: Diagnosis not present

## 2022-11-11 DIAGNOSIS — G4700 Insomnia, unspecified: Secondary | ICD-10-CM | POA: Diagnosis not present

## 2022-11-11 DIAGNOSIS — Z0000 Encounter for general adult medical examination without abnormal findings: Secondary | ICD-10-CM | POA: Diagnosis not present

## 2022-11-11 DIAGNOSIS — Z1339 Encounter for screening examination for other mental health and behavioral disorders: Secondary | ICD-10-CM | POA: Diagnosis not present

## 2022-11-11 DIAGNOSIS — Z1331 Encounter for screening for depression: Secondary | ICD-10-CM | POA: Diagnosis not present

## 2022-11-11 DIAGNOSIS — R82998 Other abnormal findings in urine: Secondary | ICD-10-CM | POA: Diagnosis not present

## 2023-01-31 IMAGING — CT CT HEAD W/O CM
2 series · 15 of 30 positions shown, 17 images · non-contrast
Comparison: 10/11/2018

CLINICAL DATA: Confusion

Memory loss
EXAM:
CT HEAD WITHOUT CONTRAST
TECHNIQUE: Contiguous axial images were obtained from the base of the skull
through the vertex without intravenous contrast.
RADIATION DOSE REDUCTION: This exam was performed according to the
departmental dose-optimization program which includes automated
exposure control, adjustment of the mA and/or kV according to
patient size and/or use of iterative reconstruction technique.

[Series 2: head without · axial · non-contrast · 0.44mm/px · z∈[-189,-59]mm · 7 of 36 slices shown, 9 images]
[im 5/36  brain]
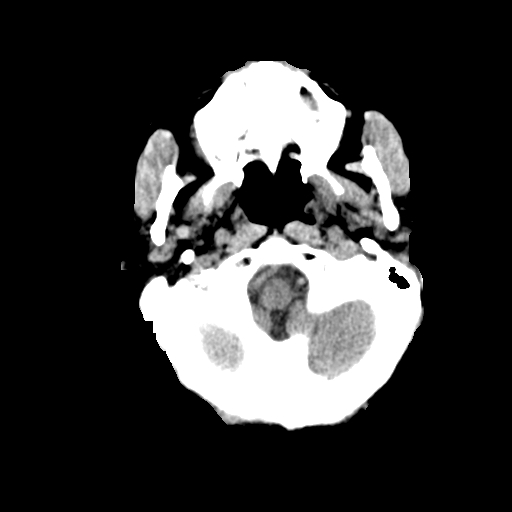
[im 5/36  bone]
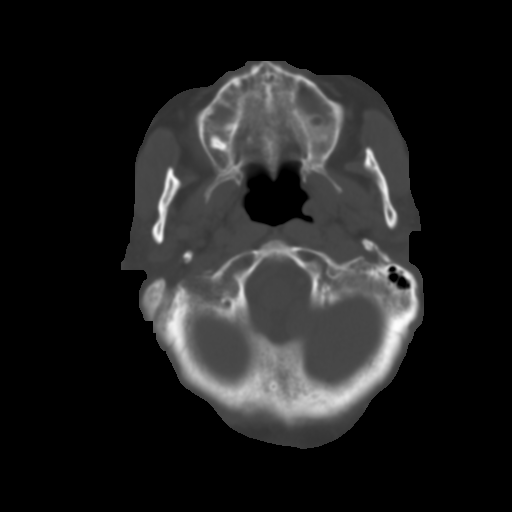
[im 9/36  brain]
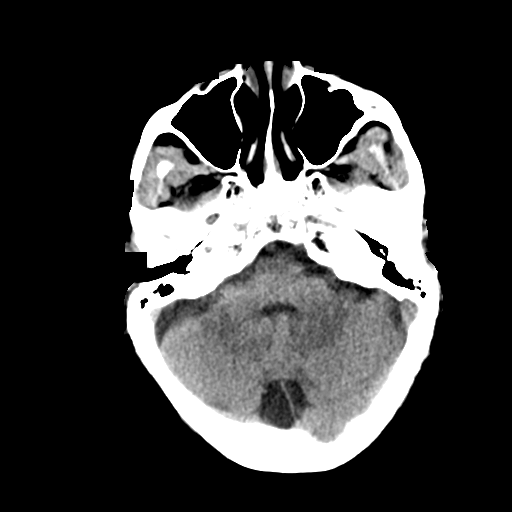
[im 14/36  brain]
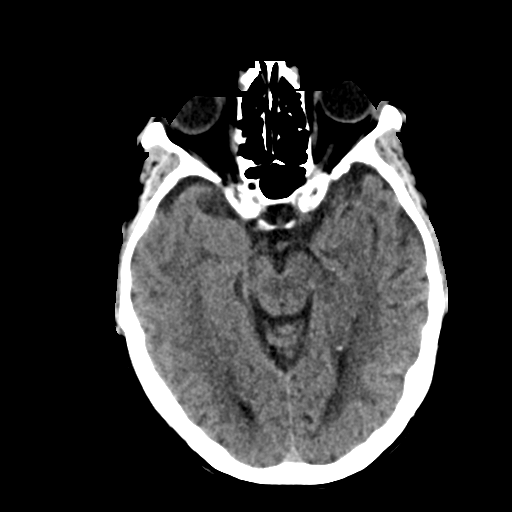
[im 18/36  brain]
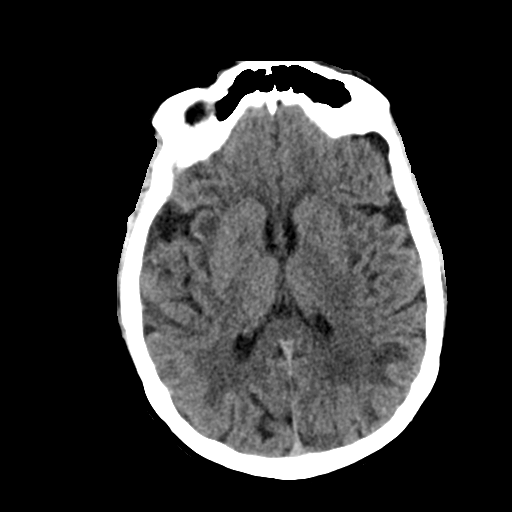
[im 22/36  brain]
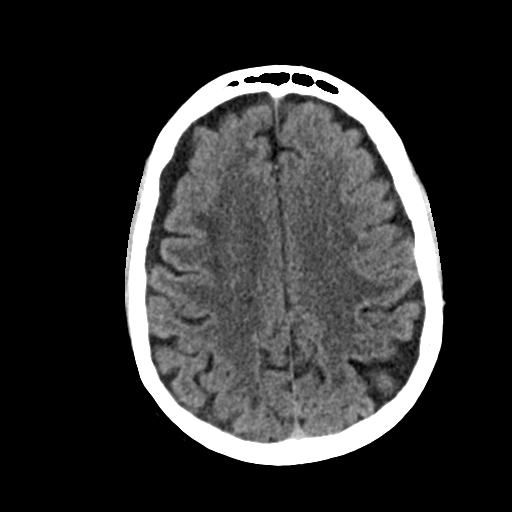
[im 22/36  bone]
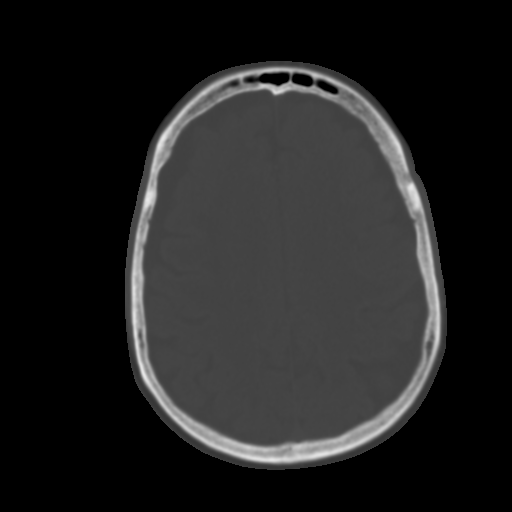
[im 27/36  brain]
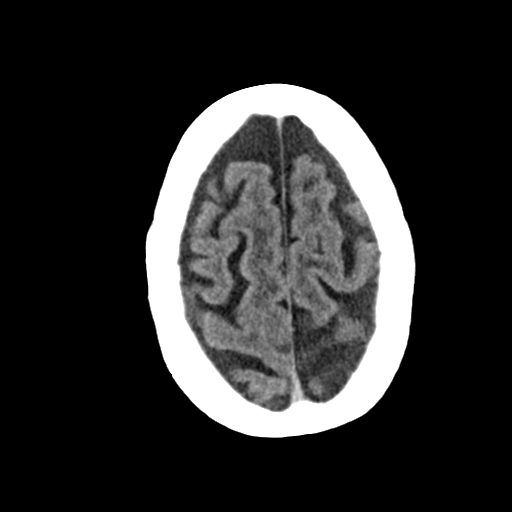
[im 31/36  brain]
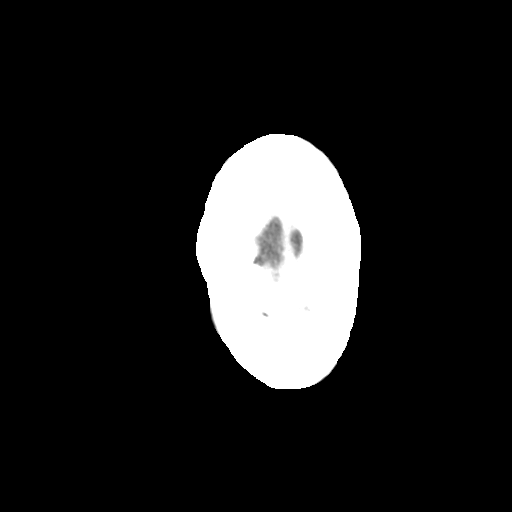

[Series 3: head bone · axial · 0.44mm/px · z∈[-193,-53]mm · 8 of 88 slices shown]
[im 9/88  bone]
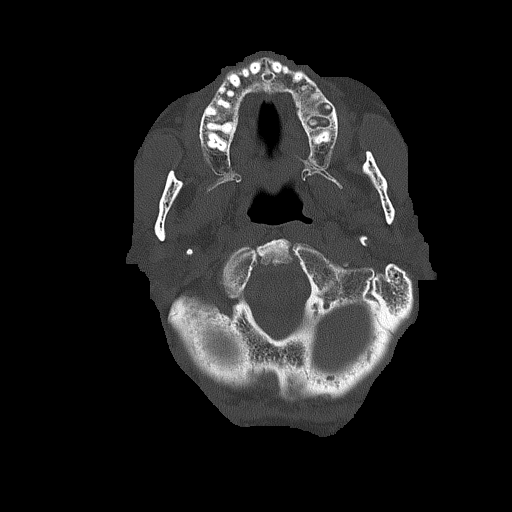
[im 18/88  bone]
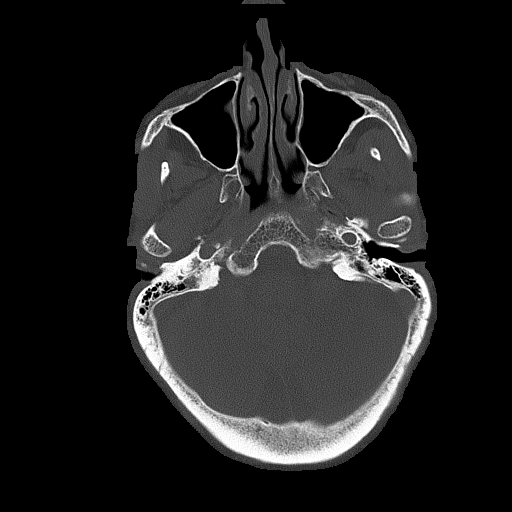
[im 27/88  bone]
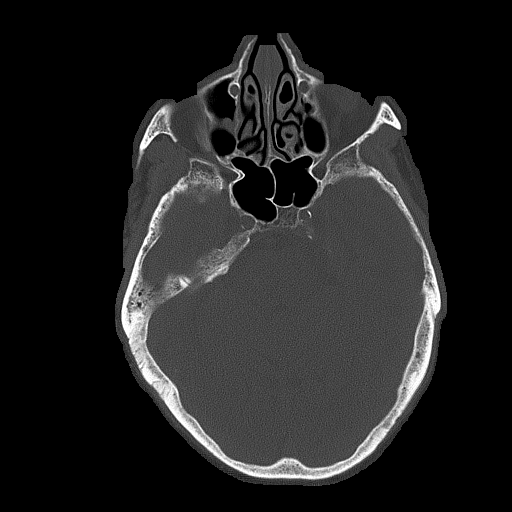
[im 40/88  bone]
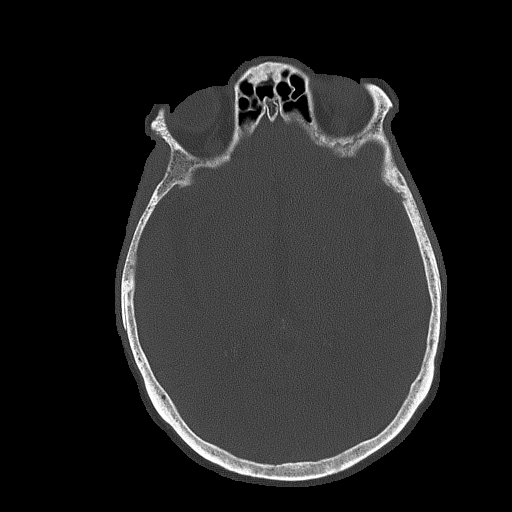
[im 48/88  bone]
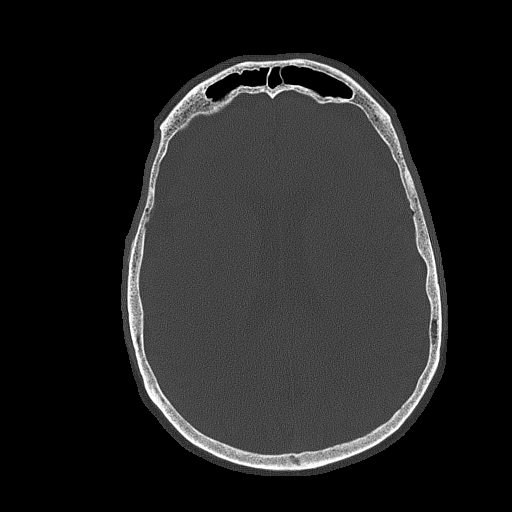
[im 61/88  bone]
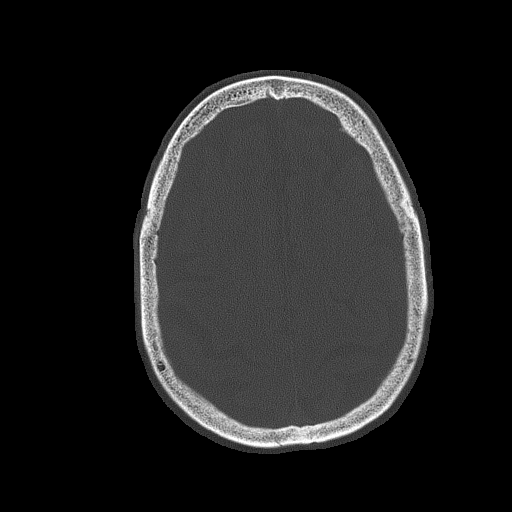
[im 70/88  bone]
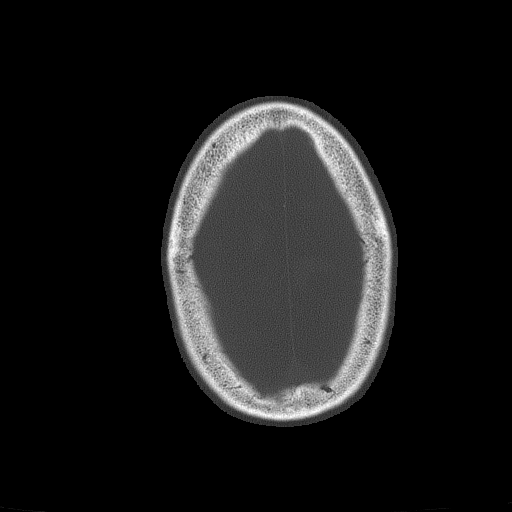
[im 79/88  bone]
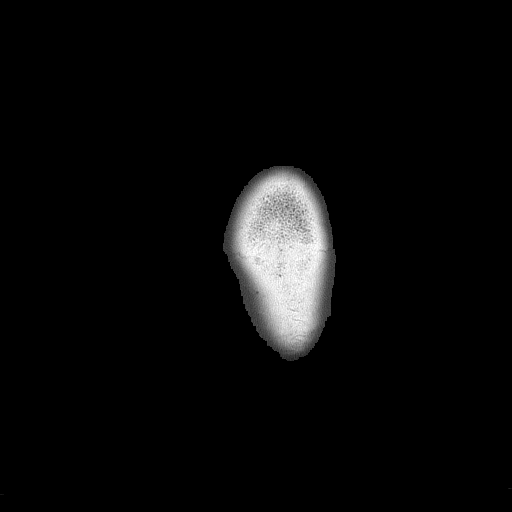

[15 of 30 positions shown; findings below may reference images not displayed]

FINDINGS: Brain: No evidence of acute infarction, hemorrhage, hydrocephalus,
or mass lesion/mass effect.

There is diffuse cortical atrophy. Prominence of the overlying CSF
spaces are most likely due to cortical volume loss, however may also
be related to chronic subdural hematomas or hygromas.

Vascular: No hyperdense vessel or unexpected calcification.

Skull: Normal. Negative for fracture or focal lesion.

Sinuses/Orbits: Partial opacification of the maxillary sinuses with
lobulated mucosal thickening which may be due to additional cysts
polyps. Focal lucencies surrounding multiple maxillary teeth
consistent with periodontal disease.

Other: None.
IMPRESSION: 1. No acute intracranial abnormality.
2. Unchanged symmetric prominence of the CSF spaces overlying the
cerebral convexities most likely due to cortical volume loss.
Chronic subdural hygromas or hematomas can have a similar appearance
and could be better evaluated with MRI.
3. Extensive periodontal disease. There is communication between the
floor of the left maxillary sinus and the periapical lucency
surrounding the first left molar.

## 2023-04-19 DIAGNOSIS — R2689 Other abnormalities of gait and mobility: Secondary | ICD-10-CM | POA: Diagnosis not present

## 2023-04-19 DIAGNOSIS — F329 Major depressive disorder, single episode, unspecified: Secondary | ICD-10-CM | POA: Diagnosis not present

## 2023-04-19 DIAGNOSIS — I10 Essential (primary) hypertension: Secondary | ICD-10-CM | POA: Diagnosis not present

## 2023-04-19 DIAGNOSIS — H547 Unspecified visual loss: Secondary | ICD-10-CM | POA: Diagnosis not present

## 2023-04-19 DIAGNOSIS — Z024 Encounter for examination for driving license: Secondary | ICD-10-CM | POA: Diagnosis not present

## 2023-04-19 DIAGNOSIS — Z23 Encounter for immunization: Secondary | ICD-10-CM | POA: Diagnosis not present

## 2023-05-12 DIAGNOSIS — G4700 Insomnia, unspecified: Secondary | ICD-10-CM | POA: Diagnosis not present

## 2023-05-12 DIAGNOSIS — E785 Hyperlipidemia, unspecified: Secondary | ICD-10-CM | POA: Diagnosis not present

## 2023-05-12 DIAGNOSIS — R2689 Other abnormalities of gait and mobility: Secondary | ICD-10-CM | POA: Diagnosis not present

## 2023-05-12 DIAGNOSIS — I10 Essential (primary) hypertension: Secondary | ICD-10-CM | POA: Diagnosis not present

## 2023-05-12 DIAGNOSIS — J302 Other seasonal allergic rhinitis: Secondary | ICD-10-CM | POA: Diagnosis not present

## 2023-11-08 DIAGNOSIS — I10 Essential (primary) hypertension: Secondary | ICD-10-CM | POA: Diagnosis not present

## 2023-11-08 DIAGNOSIS — Z79899 Other long term (current) drug therapy: Secondary | ICD-10-CM | POA: Diagnosis not present

## 2023-11-08 DIAGNOSIS — E785 Hyperlipidemia, unspecified: Secondary | ICD-10-CM | POA: Diagnosis not present

## 2023-11-15 DIAGNOSIS — F329 Major depressive disorder, single episode, unspecified: Secondary | ICD-10-CM | POA: Diagnosis not present

## 2023-11-15 DIAGNOSIS — E785 Hyperlipidemia, unspecified: Secondary | ICD-10-CM | POA: Diagnosis not present

## 2023-11-15 DIAGNOSIS — Z23 Encounter for immunization: Secondary | ICD-10-CM | POA: Diagnosis not present

## 2023-11-15 DIAGNOSIS — G3184 Mild cognitive impairment, so stated: Secondary | ICD-10-CM | POA: Diagnosis not present

## 2023-11-15 DIAGNOSIS — Z0000 Encounter for general adult medical examination without abnormal findings: Secondary | ICD-10-CM | POA: Diagnosis not present

## 2023-11-15 DIAGNOSIS — R82998 Other abnormal findings in urine: Secondary | ICD-10-CM | POA: Diagnosis not present

## 2023-11-15 DIAGNOSIS — Z1339 Encounter for screening examination for other mental health and behavioral disorders: Secondary | ICD-10-CM | POA: Diagnosis not present

## 2023-11-15 DIAGNOSIS — M159 Polyosteoarthritis, unspecified: Secondary | ICD-10-CM | POA: Diagnosis not present

## 2023-11-15 DIAGNOSIS — I10 Essential (primary) hypertension: Secondary | ICD-10-CM | POA: Diagnosis not present

## 2023-11-15 DIAGNOSIS — G4700 Insomnia, unspecified: Secondary | ICD-10-CM | POA: Diagnosis not present

## 2023-11-15 DIAGNOSIS — R2689 Other abnormalities of gait and mobility: Secondary | ICD-10-CM | POA: Diagnosis not present

## 2023-11-15 DIAGNOSIS — Z1331 Encounter for screening for depression: Secondary | ICD-10-CM | POA: Diagnosis not present

## 2024-05-17 DIAGNOSIS — G3184 Mild cognitive impairment, so stated: Secondary | ICD-10-CM | POA: Diagnosis not present

## 2024-05-17 DIAGNOSIS — M159 Polyosteoarthritis, unspecified: Secondary | ICD-10-CM | POA: Diagnosis not present

## 2024-05-17 DIAGNOSIS — Z23 Encounter for immunization: Secondary | ICD-10-CM | POA: Diagnosis not present

## 2024-05-17 DIAGNOSIS — E785 Hyperlipidemia, unspecified: Secondary | ICD-10-CM | POA: Diagnosis not present

## 2024-05-17 DIAGNOSIS — I10 Essential (primary) hypertension: Secondary | ICD-10-CM | POA: Diagnosis not present

## 2024-05-23 DIAGNOSIS — L820 Inflamed seborrheic keratosis: Secondary | ICD-10-CM | POA: Diagnosis not present

## 2024-05-23 DIAGNOSIS — L720 Epidermal cyst: Secondary | ICD-10-CM | POA: Diagnosis not present

## 2024-05-23 DIAGNOSIS — C4441 Basal cell carcinoma of skin of scalp and neck: Secondary | ICD-10-CM | POA: Diagnosis not present
